# Patient Record
Sex: Female | Born: 1980 | Race: White | Hispanic: No | Marital: Single | Smoking: Current every day smoker
Health system: Southern US, Community
[De-identification: ages and names within clinical notes are randomized; demographics above are authoritative.]

## PROBLEM LIST (undated history)

## (undated) DIAGNOSIS — S069X9A Unspecified intracranial injury with loss of consciousness of unspecified duration, initial encounter: Secondary | ICD-10-CM

## (undated) DIAGNOSIS — J45909 Unspecified asthma, uncomplicated: Secondary | ICD-10-CM

## (undated) DIAGNOSIS — F0781 Postconcussional syndrome: Secondary | ICD-10-CM

## (undated) DIAGNOSIS — S069XAA Unspecified intracranial injury with loss of consciousness status unknown, initial encounter: Secondary | ICD-10-CM

---

## 2017-03-10 ENCOUNTER — Encounter (HOSPITAL_COMMUNITY): Payer: Self-pay

## 2017-03-10 ENCOUNTER — Emergency Department (HOSPITAL_COMMUNITY): Payer: Worker's Compensation

## 2017-03-10 ENCOUNTER — Emergency Department (HOSPITAL_COMMUNITY)
Admission: EM | Admit: 2017-03-10 | Discharge: 2017-03-10 | Disposition: A | Discharge status: Home / Self Care | Payer: Worker's Compensation | Attending: Emergency Medicine | Admitting: Emergency Medicine

## 2017-03-10 DIAGNOSIS — S199XXA Unspecified injury of neck, initial encounter: Secondary | ICD-10-CM | POA: Diagnosis present

## 2017-03-10 DIAGNOSIS — F17210 Nicotine dependence, cigarettes, uncomplicated: Secondary | ICD-10-CM | POA: Insufficient documentation

## 2017-03-10 DIAGNOSIS — Y999 Unspecified external cause status: Secondary | ICD-10-CM | POA: Diagnosis not present

## 2017-03-10 DIAGNOSIS — J45909 Unspecified asthma, uncomplicated: Secondary | ICD-10-CM | POA: Insufficient documentation

## 2017-03-10 DIAGNOSIS — Y92410 Unspecified street and highway as the place of occurrence of the external cause: Secondary | ICD-10-CM | POA: Insufficient documentation

## 2017-03-10 DIAGNOSIS — R51 Headache: Secondary | ICD-10-CM | POA: Diagnosis not present

## 2017-03-10 DIAGNOSIS — R519 Headache, unspecified: Secondary | ICD-10-CM

## 2017-03-10 DIAGNOSIS — Y939 Activity, unspecified: Secondary | ICD-10-CM | POA: Diagnosis not present

## 2017-03-10 DIAGNOSIS — M542 Cervicalgia: Secondary | ICD-10-CM

## 2017-03-10 HISTORY — DX: Unspecified asthma, uncomplicated: J45.909

## 2017-03-10 MED ORDER — METHOCARBAMOL 500 MG PO TABS
500.0000 mg | ORAL_TABLET | Freq: Once | ORAL | Status: AC
  Administered 2017-03-10: 500 mg via ORAL
  Filled 2017-03-10: qty 1

## 2017-03-10 MED ORDER — NAPROXEN 250 MG PO TABS
500.0000 mg | ORAL_TABLET | Freq: Once | ORAL | Status: AC
  Administered 2017-03-10: 500 mg via ORAL
  Filled 2017-03-10: qty 2

## 2017-03-10 MED ORDER — METHOCARBAMOL 500 MG PO TABS: 500.0000 mg | ORAL_TABLET | Freq: Two times a day (BID) | ORAL | 0 refills | Status: AC

## 2017-03-10 MED ORDER — NAPROXEN 500 MG PO TABS: 500.0000 mg | ORAL_TABLET | Freq: Two times a day (BID) | ORAL | 0 refills | Status: AC
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
## 2017-08-26 ENCOUNTER — Emergency Department (HOSPITAL_COMMUNITY): Payer: Self-pay

## 2017-08-26 ENCOUNTER — Emergency Department (HOSPITAL_COMMUNITY)
Admission: EM | Admit: 2017-08-26 | Discharge: 2017-08-27 | Disposition: A | Discharge status: Home / Self Care | Payer: Self-pay | Attending: Emergency Medicine | Admitting: Emergency Medicine

## 2017-08-26 ENCOUNTER — Encounter (HOSPITAL_COMMUNITY): Payer: Self-pay | Admitting: Emergency Medicine

## 2017-08-26 DIAGNOSIS — F17210 Nicotine dependence, cigarettes, uncomplicated: Secondary | ICD-10-CM | POA: Insufficient documentation

## 2017-08-26 DIAGNOSIS — N12 Tubulo-interstitial nephritis, not specified as acute or chronic: Secondary | ICD-10-CM | POA: Insufficient documentation

## 2017-08-26 DIAGNOSIS — Z79899 Other long term (current) drug therapy: Secondary | ICD-10-CM | POA: Insufficient documentation

## 2017-08-26 DIAGNOSIS — J45909 Unspecified asthma, uncomplicated: Secondary | ICD-10-CM | POA: Insufficient documentation

## 2017-08-26 HISTORY — DX: Postconcussional syndrome: F07.81

## 2017-08-26 HISTORY — DX: Unspecified intracranial injury with loss of consciousness of unspecified duration, initial encounter: S06.9X9A

## 2017-08-26 HISTORY — DX: Unspecified intracranial injury with loss of consciousness status unknown, initial encounter: S06.9XAA

## 2017-08-26 LAB — URINALYSIS, ROUTINE W REFLEX MICROSCOPIC
Bilirubin Urine: NEGATIVE
GLUCOSE, UA: NEGATIVE mg/dL
Ketones, ur: NEGATIVE mg/dL
NITRITE: NEGATIVE
PH: 7 (ref 5.0–8.0)
PROTEIN: 30 mg/dL — AB
Specific Gravity, Urine: 1.012 (ref 1.005–1.030)

## 2017-08-26 LAB — COMPREHENSIVE METABOLIC PANEL
ALK PHOS: 60 U/L (ref 38–126)
ALT: 6 U/L — ABNORMAL LOW (ref 14–54)
ANION GAP: 8 (ref 5–15)
AST: 17 U/L (ref 15–41)
Albumin: 4.1 g/dL (ref 3.5–5.0)
BILIRUBIN TOTAL: 0.6 mg/dL (ref 0.3–1.2)
BUN: 13 mg/dL (ref 6–20)
CALCIUM: 9.5 mg/dL (ref 8.9–10.3)
CO2: 24 mmol/L (ref 22–32)
Chloride: 105 mmol/L (ref 101–111)
Creatinine, Ser: 0.89 mg/dL (ref 0.44–1.00)
GFR calc non Af Amer: >60 mL/min (ref >60)
GLUCOSE: 94 mg/dL (ref 65–99)
POTASSIUM: 4.2 mmol/L (ref 3.5–5.1)
Sodium: 137 mmol/L (ref 135–145)
TOTAL PROTEIN: 7.2 g/dL (ref 6.5–8.1)

## 2017-08-26 LAB — CBC
HEMATOCRIT: 40.7 % (ref 36.0–46.0)
HEMOGLOBIN: 13.7 g/dL (ref 12.0–15.0)
MCH: 30.6 pg (ref 26.0–34.0)
MCHC: 33.7 g/dL (ref 30.0–36.0)
MCV: 90.8 fL (ref 78.0–100.0)
PLATELETS: 206 10*3/uL (ref 150–400)
RBC: 4.48 MIL/uL (ref 3.87–5.11)
RDW: 12.9 % (ref 11.5–15.5)
WBC: 9.6 10*3/uL (ref 4.0–10.5)

## 2017-08-26 LAB — I-STAT BETA HCG BLOOD, ED (MC, WL, AP ONLY): I-stat hCG, quantitative: <5 m[IU]/mL (ref <5)

## 2017-08-26 LAB — WET PREP, GENITAL
SPERM: NONE SEEN
TRICH WET PREP: NONE SEEN
YEAST WET PREP: NONE SEEN

## 2017-08-26 LAB — LIPASE, BLOOD: Lipase: 34 U/L (ref 11–51)

## 2017-08-26 MED ORDER — CEPHALEXIN 500 MG PO CAPS: 500.0000 mg | ORAL_CAPSULE | Freq: Four times a day (QID) | ORAL | 0 refills | Status: AC

## 2017-08-26 MED ORDER — KETOROLAC TROMETHAMINE 30 MG/ML IJ SOLN
15.0000 mg | Freq: Once | INTRAMUSCULAR | Status: AC
  Administered 2017-08-27: 15 mg via INTRAVENOUS
  Filled 2017-08-26: qty 1

## 2017-08-26 MED ORDER — ONDANSETRON HCL 4 MG/2ML IJ SOLN
4.0000 mg | Freq: Once | INTRAMUSCULAR | Status: AC
  Administered 2017-08-26: 4 mg via INTRAVENOUS
  Filled 2017-08-26: qty 2

## 2017-08-26 MED ORDER — ONDANSETRON 8 MG PO TBDP: 8.0000 mg | ORAL_TABLET | Freq: Three times a day (TID) | ORAL | 0 refills | Status: AC | PRN

## 2017-08-26 MED ORDER — METRONIDAZOLE 500 MG PO TABS: 500.0000 mg | ORAL_TABLET | Freq: Two times a day (BID) | ORAL | 0 refills | Status: AC

## 2017-08-26 MED ORDER — MORPHINE SULFATE (PF) 4 MG/ML IV SOLN
4.0000 mg | Freq: Once | INTRAVENOUS | Status: AC
  Administered 2017-08-26: 4 mg via INTRAVENOUS
  Filled 2017-08-26: qty 1

## 2017-08-26 MED ORDER — DEXTROSE 5 % IV SOLN
1.0000 g | INTRAVENOUS | Status: DC
  Administered 2017-08-26: 1 g via INTRAVENOUS
  Filled 2017-08-26: qty 10

## 2017-08-26 MED ORDER — SODIUM CHLORIDE 0.9 % IV SOLN: INTRAVENOUS | Status: DC

## 2017-08-26 MED ORDER — OXYCODONE-ACETAMINOPHEN 5-325 MG PO TABS: 2.0000 | ORAL_TABLET | ORAL | 0 refills | Status: AC | PRN

## 2017-08-26 MED ORDER — SODIUM CHLORIDE 0.9 % IV BOLUS (SEPSIS)
1000.0000 mL | Freq: Once | INTRAVENOUS | Status: AC
  Administered 2017-08-26: 1000 mL via INTRAVENOUS

## 2017-08-26 NOTE — ED Triage Notes (Addendum)
Patient c/o severe RLQ and right pelvic pain since this morning. Denies V/D. Denies vaginal discharge and bleeding.

## 2017-08-26 NOTE — ED Notes (Signed)
Once antibiotics are completed, Huntley Dec, RN will collect HIV antibody.

## 2017-08-26 NOTE — ED Notes (Signed)
Pelvic cart is waiting at bedside.

## 2017-08-26 NOTE — ED Provider Notes (Signed)
[facility]-EMERGENCY DEPT Provider Note   CSN: [id number] Arrival date & time: 08/26/17  1333     History   Chief Complaint Chief Complaint  Patient presents with  . Abdominal Pain  . Pelvic Pain    HPI Pam Best is a 36 y.o. female.  36 year old female presents with 24-hour history of right lower quadrant pain it radiates to her right flank. Denies any vaginal bleeding or discharge. Denies any dysuria or hematuria. No fever or chills. Pain is dull and persistent and is worse with movement. No prior history of same. Patient is sexually active with the same partner for several years. Denies any prior history of PID. Does have a history of C-section in the past. Denies any diarrhea or change in her bowel habits. No treatment use prior to arrival      Past Medical History:  Diagnosis Date  . Asthma   . Post concussion syndrome   . TBI (traumatic brain injury) (HCC)     There are no active problems to display for this patient.   Past Surgical History:  Procedure Laterality Date  . CESAREAN SECTION      OB History    No data available       Home Medications    Prior to Admission medications   Medication Sig Start Date End Date Taking? Authorizing Provider  methocarbamol (ROBAXIN) 500 MG tablet Take 1 tablet (500 mg total) by mouth 2 (two) times daily. 03/10/17   Garlon Hatchet, PA-C  naproxen (NAPROSYN) 500 MG tablet Take 1 tablet (500 mg total) by mouth 2 (two) times daily with a meal. 03/10/17   Garlon Hatchet, PA-C    Family History History reviewed. No pertinent family history.  Social History Social History  Substance Use Topics  . Smoking status: Current Every Day Smoker    Packs/day: 0.50    Types: Cigarettes  . Smokeless tobacco: Never Used  . Alcohol use Yes     Comment: occasional      Allergies   Nortriptyline   Review of Systems Review of Systems  All other systems reviewed and are negative.    Physical Exam Updated Vital Signs BP  132/87 (BP Location: Left Arm)   Pulse 93   Temp 98.6 F (37 C) (Oral)   Resp 18   Ht 1.6 m ( )   Wt 68.9 kg (152 lb)   LMP 06/22/2017   SpO2 100%   BMI 26.93 kg/m   Physical Exam  Constitutional: She is oriented to person, place, and time. She appears well-developed and well-nourished.  Non-toxic appearance. No distress.  HENT:  Head: Normocephalic and atraumatic.  Eyes: Pupils are equal, round, and reactive to light. Conjunctivae, EOM and lids are normal.  Neck: Normal range of motion. Neck supple. No tracheal deviation present. No thyroid mass present.  Cardiovascular: Normal rate, regular rhythm and normal heart sounds.  Exam reveals no gallop.   No murmur heard. Pulmonary/Chest: Effort normal and breath sounds normal. No stridor. No respiratory distress. She has no decreased breath sounds. She has no wheezes. She has no rhonchi. She has no rales.  Abdominal: Soft. Normal appearance and bowel sounds are normal. She exhibits no distension. There is no tenderness. There is no rebound and no CVA tenderness.    Genitourinary: Vagina normal. There is no rash on the right labia. There is no rash on the left labia. Right adnexum displays no mass. Left adnexum displays no mass.  Musculoskeletal: Normal range of  motion. She exhibits no edema or tenderness.  Neurological: She is alert and oriented to person, place, and time. She has normal strength. No cranial nerve deficit or sensory deficit. GCS eye subscore is 4. GCS verbal subscore is 5. GCS motor subscore is 6.  Skin: Skin is warm and dry. No abrasion and no rash noted.  Psychiatric: She has a normal mood and affect. Her speech is normal and behavior is normal.  Nursing note and vitals reviewed.    ED Treatments / Results  Labs (all labs ordered are listed, but only abnormal results are displayed) Labs Reviewed  COMPREHENSIVE METABOLIC PANEL - Abnormal; Notable for the following:       Result Value   ALT 6 (*)    All other  components within normal limits  URINALYSIS, ROUTINE W REFLEX MICROSCOPIC - Abnormal; Notable for the following:    APPearance HAZY (*)    Hgb urine dipstick MODERATE (*)    Protein, ur 30 (*)    Leukocytes, UA LARGE (*)    Bacteria, UA RARE (*)    Squamous Epithelial / LPF 0-5 (*)    All other components within normal limits  WET PREP, GENITAL  LIPASE, BLOOD  CBC  HIV ANTIBODY (ROUTINE TESTING)  I-STAT BETA HCG BLOOD, ED ([facility], [facility], [facility] ONLY)  GC/CHLAMYDIA PROBE AMP ([facility]) NOT AT [facility]    EKG  EKG Interpretation None       Radiology No results found.  Procedures Procedures (including critical care time)  Medications Ordered in ED Medications  sodium chloride 0.9 % bolus 1,000 mL (not administered)  0.9 %  sodium chloride infusion (not administered)  cefTRIAXone (ROCEPHIN) 1 g in dextrose 5 % 50 mL IVPB (not administered)  morphine 4 MG/ML injection 4 mg (not administered)  ondansetron (ZOFRAN) injection 4 mg (not administered)     Initial Impression / Assessment and Plan / ED Course  I have reviewed the triage vital signs and the nursing notes.  Pertinent labs & imaging results that were available during my care of the patient were reviewed by me and considered in my medical decision making (see chart for details).     Patient medicated for pain here and feels better. Has evidence of BV on her wet prep. 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 Pelvic ultrasound negative for acute pathology. CT also without acute findings. Patient given IV antibiotics here. Will place on Keflex, Flagyl, opiate medication  Final Clinical Impressions(s) / ED Diagnoses   Final diagnoses:  None    New Prescriptions New Prescriptions   No medications on file     Lorre Nick, MD 08/26/17 2304

## 2017-08-28 LAB — GC/CHLAMYDIA PROBE AMP (~~LOC~~) NOT AT ARMC
Chlamydia: NEGATIVE
NEISSERIA GONORRHEA: NEGATIVE

## 2018-05-20 ENCOUNTER — Emergency Department (HOSPITAL_COMMUNITY)
Admission: EM | Admit: 2018-05-20 | Discharge: 2018-05-20 | Disposition: A | Discharge status: Home / Self Care | Payer: Medicaid Other | Attending: Emergency Medicine | Admitting: Emergency Medicine

## 2018-05-20 ENCOUNTER — Emergency Department (HOSPITAL_COMMUNITY): Payer: Medicaid Other

## 2018-05-20 ENCOUNTER — Encounter (HOSPITAL_COMMUNITY): Payer: Self-pay | Admitting: Emergency Medicine

## 2018-05-20 DIAGNOSIS — J45909 Unspecified asthma, uncomplicated: Secondary | ICD-10-CM | POA: Diagnosis not present

## 2018-05-20 DIAGNOSIS — F17210 Nicotine dependence, cigarettes, uncomplicated: Secondary | ICD-10-CM | POA: Insufficient documentation

## 2018-05-20 DIAGNOSIS — R9431 Abnormal electrocardiogram [ECG] [EKG]: Secondary | ICD-10-CM | POA: Diagnosis not present

## 2018-05-20 DIAGNOSIS — R112 Nausea with vomiting, unspecified: Secondary | ICD-10-CM | POA: Diagnosis not present

## 2018-05-20 DIAGNOSIS — R200 Anesthesia of skin: Secondary | ICD-10-CM | POA: Diagnosis not present

## 2018-05-20 DIAGNOSIS — M542 Cervicalgia: Secondary | ICD-10-CM

## 2018-05-20 DIAGNOSIS — Z79899 Other long term (current) drug therapy: Secondary | ICD-10-CM | POA: Insufficient documentation

## 2018-05-20 LAB — CBC WITH DIFFERENTIAL/PLATELET
Abs Immature Granulocytes: 0 10*3/uL (ref 0.0–0.1)
BASOS PCT: 1 %
Basophils Absolute: 0 10*3/uL (ref 0.0–0.1)
EOS ABS: 0.1 10*3/uL (ref 0.0–0.7)
EOS PCT: 2 %
HCT: 37.1 % (ref 36.0–46.0)
Hemoglobin: 12.3 g/dL (ref 12.0–15.0)
Immature Granulocytes: 0 %
Lymphocytes Relative: 36 %
Lymphs Abs: 2.4 10*3/uL (ref 0.7–4.0)
MCH: 29.6 pg (ref 26.0–34.0)
MCHC: 33.2 g/dL (ref 30.0–36.0)
MCV: 89.4 fL (ref 78.0–100.0)
MONO ABS: 0.5 10*3/uL (ref 0.1–1.0)
Monocytes Relative: 8 %
Neutro Abs: 3.5 10*3/uL (ref 1.7–7.7)
Neutrophils Relative %: 53 %
Platelets: 167 10*3/uL (ref 150–400)
RBC: 4.15 MIL/uL (ref 3.87–5.11)
RDW: 13.2 % (ref 11.5–15.5)
WBC: 6.6 10*3/uL (ref 4.0–10.5)

## 2018-05-20 LAB — COMPREHENSIVE METABOLIC PANEL
ALBUMIN: 3.9 g/dL (ref 3.5–5.0)
ALT: 9 U/L — AB (ref 14–54)
AST: 23 U/L (ref 15–41)
Alkaline Phosphatase: 57 U/L (ref 38–126)
Anion gap: 9 (ref 5–15)
BILIRUBIN TOTAL: 1 mg/dL (ref 0.3–1.2)
BUN: 13 mg/dL (ref 6–20)
CHLORIDE: 105 mmol/L (ref 101–111)
CO2: 24 mmol/L (ref 22–32)
CREATININE: 1.07 mg/dL — AB (ref 0.44–1.00)
Calcium: 9 mg/dL (ref 8.9–10.3)
GFR calc Af Amer: >60 mL/min (ref >60)
GLUCOSE: 89 mg/dL (ref 65–99)
POTASSIUM: 3.5 mmol/L (ref 3.5–5.1)
Sodium: 138 mmol/L (ref 135–145)
Total Protein: 6.5 g/dL (ref 6.5–8.1)

## 2018-05-20 LAB — RAPID URINE DRUG SCREEN, HOSP PERFORMED
Amphetamines: NOT DETECTED
Benzodiazepines: POSITIVE — AB
Cocaine: NOT DETECTED
Opiates: NOT DETECTED
TETRAHYDROCANNABINOL: NOT DETECTED

## 2018-05-20 LAB — URINALYSIS, ROUTINE W REFLEX MICROSCOPIC
Bilirubin Urine: NEGATIVE
Glucose, UA: NEGATIVE mg/dL
Hgb urine dipstick: NEGATIVE
KETONES UR: 5 mg/dL — AB
Leukocytes, UA: NEGATIVE
Nitrite: NEGATIVE
PH: 7 (ref 5.0–8.0)
Protein, ur: NEGATIVE mg/dL
SPECIFIC GRAVITY, URINE: 1.019 (ref 1.005–1.030)

## 2018-05-20 LAB — RAPID HIV SCREEN (HIV 1/2 AB+AG)
HIV 1/2 ANTIBODIES: NONREACTIVE
HIV-1 P24 ANTIGEN - HIV24: NONREACTIVE

## 2018-05-20 LAB — POC URINE PREG, ED: Preg Test, Ur: NEGATIVE

## 2018-05-20 LAB — CK: CK TOTAL: 196 U/L (ref 38–234)

## 2018-05-20 LAB — ETHANOL

## 2018-05-20 MED ORDER — DIAZEPAM 5 MG PO TABS
5.0000 mg | ORAL_TABLET | Freq: Once | ORAL | Status: AC
  Administered 2018-05-20: 5 mg via ORAL
  Filled 2018-05-20: qty 1

## 2018-05-20 MED ORDER — ONDANSETRON 4 MG PO TBDP
4.0000 mg | ORAL_TABLET | Freq: Once | ORAL | Status: AC
  Administered 2018-05-20: 4 mg via ORAL
  Filled 2018-05-20: qty 1

## 2018-05-20 MED ORDER — SODIUM CHLORIDE 0.9 % IV BOLUS
1000.0000 mL | Freq: Once | INTRAVENOUS | Status: AC
  Administered 2018-05-20: 1000 mL via INTRAVENOUS

## 2018-05-20 MED ORDER — ONDANSETRON HCL 4 MG PO TABS
4.0000 mg | ORAL_TABLET | Freq: Two times a day (BID) | ORAL | 0 refills | Status: AC | PRN
Start: 2018-05-20 — End: ?

## 2018-05-20 MED ORDER — TETRACAINE HCL 0.5 % OP SOLN
1.0000 [drp] | Freq: Once | OPHTHALMIC | Status: AC
  Administered 2018-05-20: 1 [drp] via OPHTHALMIC
  Filled 2018-05-20: qty 4

## 2018-05-20 NOTE — ED Notes (Signed)
PA at bedside to check eye pressure

## 2018-05-20 NOTE — ED Notes (Signed)
Pt remains at MRI

## 2018-05-20 NOTE — ED Notes (Signed)
Pt escorted to the RR for urine sample.

## 2018-05-20 NOTE — ED Triage Notes (Signed)
Pt states she has had neck pain, stiff neck, blurred visions with spots to both eyes, nausea, diarrhea, pain to abdomen to RUQ, also has numbness to bilateral feet but this started this morning.

## 2018-05-20 NOTE — ED Notes (Signed)
Patient transported to MRI 

## 2018-05-20 NOTE — Discharge Instructions (Addendum)
Use Zofran as needed for nausea or vomiting. Use this only if vomiting is severe and causing dehydration, as it may cause heart problems that may lead to death.  Make sure you are staying well-hydrated with water. Follow-up with your neurologist on Wednesday at your scheduled appointment. Use Tylenol as needed for pain. Contact [facility] to establish primary care. Contact the to neurology groups to establish care in [place]. Return to the emergency room if you develop high fevers, blood in your stool, persistent vomiting despite medication, or any new or worsening symptoms.

## 2018-05-20 NOTE — ED Notes (Signed)
Pt alert and oriented, stable. Pt verbalized understanding of discharge instructions.

## 2018-05-20 NOTE — ED Provider Notes (Addendum)
[facility] EMERGENCY DEPARTMENT Provider Note   CSN: [id number] Arrival date & time: 05/20/18  1204     History   Chief Complaint Chief Complaint  Patient presents with  . Neck Pain  . Numbness    HPI Pam Best is a 37 y.o. female.  HPI  Patient is a 59 old female with a history of asthma, TBI, postconcussion syndrome (followed by concussion specialist) presenting for blurred vision, scotomas, numbness in feet and hands, facial numbness weakness, vomiting and diarrhea.  Patient reports that she began having left-sided neck pain  2 days ago, and now feels that her entire neck is sore particular chest pain the.  Patient is able to perform range of motion in all directions with her neck.  Patient reports that she is also had spontaneous vomiting over the past 2 days, that would wake her up from sleep.  Patient reports that she had epigastric to right upper quadrant pain that preceded this.  Patient reports that today she began experiencing bilateral blurred vision, left-sided scotomas, "numbness and tingling" at the bottom of her feet, decreased sensation in bilateral hands, feeling that her left arm is more weak, as well as numbness around her face.  Patient denies feeling any ascending weakness from the lower extremities to upper extremities.  Patient reports the symptoms began all at once.  Patient denies any rashes,  but does note that she had a longer and heavier menses than is typical for her, and has had more easy bruising and bleeding over the past 2 weeks.  Patient denies recorded fevers, but does report feeling chilled and having sweats yesterday.  Diarrhea began 2 weeks ago, and has been occurring daily.  Patient reports recent travel was to [place] in April 2019, and [place] a couple weeks ago where she was in wooded areas but denies any tick bites or rashes.  Patient denies any family history of autoimmune diseases, specifically no family history of  neurologic autoimmune diseases.  Past Medical History:  Diagnosis Date  . Asthma   . Post concussion syndrome   . TBI (traumatic brain injury) ([place])     There are no active problems to display for this patient.   Past Surgical History:  Procedure Laterality Date  . CESAREAN SECTION       OB History   None      Home Medications    Prior to Admission medications   Medication Sig Start Date End Date Taking? Authorizing Provider  aspirin-acetaminophen-caffeine (EXCEDRIN MIGRAINE) [phone] MG tablet Take 2 tablets by mouth every 6 (six) hours as needed for headache.   Yes [provider]  escitalopram (LEXAPRO) 20 MG tablet Take 30 mg by mouth at bedtime. 04/18/18  Yes [provider]  meclizine (ANTIVERT) 25 MG tablet Take 25 mg by mouth daily as needed for nausea/vomiting. 04/19/18  Yes [provider]  temazepam (RESTORIL) 30 MG capsule Take 30 mg by mouth daily as needed for sleep. 04/16/18  Yes [provider]  topiramate (TOPAMAX) 50 MG tablet Take 50 mg by mouth at bedtime. 04/25/18  Yes [provider]  traZODone (DESYREL) 50 MG tablet Take 100 mg by mouth at bedtime. 04/18/18  Yes [provider]  cephALEXin (KEFLEX) 500 MG capsule Take 1 capsule (500 mg total) by mouth 4 (four) times daily. Patient not taking: Reported on 05/20/2018 08/26/17   Lacretia Leigh, MD  methocarbamol (ROBAXIN) 500 MG tablet Take 1 tablet (500 mg total) by  mouth 2 (two) times daily. Patient not taking: Reported on 05/20/2018 03/10/17   Larene Pickett, PA-C  metroNIDAZOLE (FLAGYL) 500 MG tablet Take 1 tablet (500 mg total) by mouth 2 (two) times daily. Patient not taking: Reported on 05/20/2018 08/26/17   Lacretia Leigh, MD  naproxen (NAPROSYN) 500 MG tablet Take 1 tablet (500 mg total) by mouth 2 (two) times daily with a meal. Patient not taking: Reported on 05/20/2018 03/10/17   Larene Pickett, PA-C  ondansetron (ZOFRAN ODT) 8 MG disintegrating tablet  Take 1 tablet (8 mg total) by mouth every 8 (eight) hours as needed for nausea or vomiting. Patient not taking: Reported on 05/20/2018 08/26/17   Lacretia Leigh, MD  oxyCODONE-acetaminophen (PERCOCET/ROXICET) 5-325 MG tablet Take 2 tablets by mouth every 4 (four) hours as needed for severe pain. Patient not taking: Reported on 05/20/2018 08/26/17   Lacretia Leigh, MD    Family History No family history on file.  Social History Social History   Tobacco Use  . Smoking status: Current Every Day Smoker    Packs/day: 0.50    Types: Cigarettes  . Smokeless tobacco: Never Used  Substance Use Topics  . Alcohol use: Yes    Comment: occasional   . Drug use: Not on file     Allergies   Nortriptyline   Review of Systems Review of Systems  Constitutional: Negative for chills and fever.  HENT: Negative for congestion and rhinorrhea.   Eyes: Positive for visual disturbance. Negative for pain.  Respiratory: Negative for chest tightness and shortness of breath.   Cardiovascular: Negative for chest pain.  Gastrointestinal: Positive for abdominal pain, diarrhea, nausea and vomiting.  Genitourinary: Negative for dysuria and flank pain.  Musculoskeletal: Positive for neck pain and neck stiffness. Negative for arthralgias and myalgias.  Skin: Negative for rash.  Neurological: Positive for weakness and numbness. Negative for seizures, syncope and speech difficulty.  Psychiatric/Behavioral: Positive for sleep disturbance. Negative for confusion.     Physical Exam Updated Vital Signs BP 102/78   Pulse 72   Temp 98.4 F (36.9 C) (Oral)   Resp 17   Ht '5\' 3"'  (1.6 m)   Wt 73.9 kg (163 lb)   LMP 05/17/2018   SpO2 97%   BMI 28.87 kg/m   Physical Exam  Constitutional: She appears well-developed and well-nourished. No distress.  HENT:  Head: Normocephalic and atraumatic.  Mouth/Throat: Oropharynx is clear and moist.  Eyes: Pupils are equal, round, and reactive to light. Conjunctivae and EOM  are normal.  Tono-Pen pressure right eye 19 and left eye 16. Attempted to visualize fundi, however the process made patient nauseous.  No obvious hemorrhage, exudate, or papilledema on exam.  Neck: Normal range of motion. Neck supple.  No nuchal rigidity.  No meningismus.  Patient is able to range her neck in all directions.  Cardiovascular: Normal rate, regular rhythm, S1 normal and S2 normal.  No murmur heard. Pulmonary/Chest: Effort normal and breath sounds normal. She has no wheezes. She has no rales.  Abdominal: Soft. She exhibits no distension. There is tenderness. There is no guarding.  Mild tenderness to palpation of the right upper quadrant.  Liver nonpalpable.  Abdomen benign and nonsurgical.  Musculoskeletal: Normal range of motion. She exhibits no edema or deformity.  Neurological: She is alert.  Mental Status:  Alert, oriented, thought content appropriate, able to give a coherent history. Speech fluent without evidence of aphasia. Able to follow 2 step commands without difficulty.  Cranial Nerves:  II:  Peripheral visual fields grossly normal, pupils equal, round, reactive to light III,IV, VI: ptosis not present, extra-ocular motions intact bilaterally  V,VII: Slight drooping of right lip. Facial light touch sensation equal VIII: hearing grossly normal to voice  X: uvula elevates symmetrically  XI: bilateral shoulder shrug asymmetric with slight left sided weakness.  XII: midline tongue extension without fassiculations Motor:  Normal tone.  Left upper extremity 4+ out of 5 with grip strength and 5 out of 5 in right upper extremity.  Left lower extremity 4+ out of 5 with hip flexion, knee flexion, plantar and dorsiflexion, and right leg 5 out of 5 throughout.  Sensory: Patient is unable to distinguish sharp from dull touch in distal bilateral upper and lower extremity's. Deep Tendon Reflexes: 2+ and equal upper extremities. 3+ right patellar and right plantar. 2+ left patellar  and left plantar. No clonus. Cerebellar: normal finger-to-nose with bilateral upper extremities Gait: normal gait and balance Stance: Slight downward drifting of left arm with pronator drift but good coordination, strength, and position sense with tapping of bilateral arms (performed in sitting position). CV: distal pulses palpable throughout   Skin: Skin is warm and dry. No rash noted. No erythema.  Psychiatric: She has a normal mood and affect. Her behavior is normal. Judgment and thought content normal.  Nursing note and vitals reviewed.    ED Treatments / Results  Labs (all labs ordered are listed, but only abnormal results are displayed) Labs Reviewed  URINALYSIS, ROUTINE W REFLEX MICROSCOPIC - Abnormal; Notable for the following components:      Result Value   APPearance HAZY (*)    Ketones, ur 5 (*)    All other components within normal limits  COMPREHENSIVE METABOLIC PANEL - Abnormal; Notable for the following components:   Creatinine, Ser 1.07 (*)    ALT 9 (*)    All other components within normal limits  RAPID HIV SCREEN (HIV 1/2 AB+AG)  CK  CBC WITH DIFFERENTIAL/PLATELET  ETHANOL  RPR  RAPID URINE DRUG SCREEN, HOSP PERFORMED  POC URINE PREG, ED    EKG EKG Interpretation  Date/Time:  Sunday May 20 2018 14:42:55 EDT Ventricular Rate:  71 PR Interval:    QRS Duration: 93 QT Interval:  457 QTC Calculation: 497 R Axis:   -16 Text Interpretation:  Sinus rhythm Borderline left axis deviation Prolonged QT interval No previous tracing Confirmed by Blanchie Dessert [phone]) on 05/20/2018 3:29:21 PM   Radiology No results found.  Procedures Procedures (including critical care time)  Medications Ordered in ED Medications  sodium chloride 0.9 % bolus 1,000 mL (1,000 mLs Intravenous New Bag/Given 05/20/18 1427)  ondansetron (ZOFRAN-ODT) disintegrating tablet 4 mg (4 mg Oral Given 05/20/18 1447)     Initial Impression / Assessment and Plan / ED Course  I have  reviewed the triage vital signs and the nursing notes.  Pertinent labs & imaging results that were available during my care of the patient were reviewed by me and considered in my medical decision making (see chart for details).     Patient is nontoxic-appearing, afebrile, and in no acute distress.  Differential diagnosis includes demyelinating lesions, AIDP, brainstem CVA, TTP, neurosyphilis, HIV, neuromuscular disorder such as myasthenia gravis.  Doubt meningitis, as patient is demonstrating no meningeal signs, is afebrile, has no risk factors for viral meningitis, does not appear to be systemically ill.  Additionally, neck pain started as unilateral, and proceeded neurologic symptoms by 48 hours.  Patient symptoms are highly atypical for a IDP,  and she has no evidence of pulmonary involvement.  CBC is without any abnormality.  CMP without acute abnormality.  Rapid HIV is negative. CK is normal.  EKG demonstrates prolonged QT, possibly related to Lexapro use.  Per consultation with Dr. Rory Percy of neurology, plan to proceed with MRI of brain and cervical spine specifically looking for demyelinating lesions or any other signs of acute CVA particularly of the brainstem.  If abnormal, anticipate reconsultation of neurology. If completely normal, patient can have outpatient neurologic follow-up for other neurologic conditions.  Appreciate neurology involvement in the care of this patient.  Care signed out to Sophia Cavaccale at 4:30 PM.   Final Clinical Impressions(s) / ED Diagnoses   Final diagnoses:  Non-intractable vomiting with nausea, unspecified vomiting type  Neck pain  Numbness  Prolonged Q-T interval on ECG    ED Discharge Orders    None           Tamala Julian 05/20/18 1640    Blanchie Dessert, MD 05/21/18 2251

## 2018-05-21 LAB — RPR: RPR Ser Ql: NONREACTIVE

## 2018-05-21 NOTE — ED Provider Notes (Signed)
Pt signed out to me by A Dayton ScrapeMurray, PA-C.  Please see previous notes for further evaluation.   In brief, patient presenting for evaluation of neck pain, vomiting, blurred vision, numbness and tingling, and left arm weakness.  Additionally, reporting 2 days of diarrhea.  Initial work-up reassuring, no leukocytosis.  Creatinine stable.  Electrolytes stable.  Rapid HIV negative.  CK negative.  UA without infection.  Nausea improved with Zofran and fluids.  Plan for MRI brain and neck for further evaluation of symptoms.  MRI negative for acute findings.  Discussed with Dr. Christell ConstantMoore from neurology, who recommends outpatient follow-up for neurologic symptoms.  Discussed with patient.  Patient has appointment with neurologist on Wednesday, but would like information for neurology in [place], as her doctors in [place].  Patient requesting symptomatic treatment for vomiting and diarrhea.  Discussed with patient that antiemetics can cause widened QT which can be life threatening. Pt states she understands, would like zofran as needed. Discussed extensively risks of zofran including death. Discussed strict return precautions including CP, SOB, heart racing, SOB, or weakness. Pt to f/u with neurology and PCP. At this time, pt appears safe for d/c. Return precautions given. Pt states she understands and agrees to plan.    Alveria ApleyCaccavale, Casin Federici, PA-C 05/21/18 0129    Terrilee FilesButler, Michael C, MD 05/21/18 1144

## 2018-12-31 IMAGING — MR MR CERVICAL SPINE W/O CM
5 series · 37 of 48 positions shown · non-contrast
Comparison: 03/10/2017 CT head and cervical spine.

CLINICAL DATA: 36 y/o F; patient presenting with blurred vision,
scotomas, numbness in feet, numbness in hands, facial numbness with
weakness, vomiting, and diarrhea. History of traumatic brain injury
with postconcussion syndrome. Patient also reports left-sided neck
pain beginning 2 days ago.

EXAM:
MRI HEAD WITHOUT CONTRAST
MRI CERVICAL SPINE WITHOUT CONTRAST
TECHNIQUE: Multiplanar, multiecho pulse sequences of the brain and surrounding
structures, and cervical spine, to include the craniocervical
junction and cervicothoracic junction, were obtained without
intravenous contrast.

[Series 9: T2 · sagittal · 3.0mm · 0.69mm/px · 6 of 15 slices shown (1 of 2)]
[im 1/15]
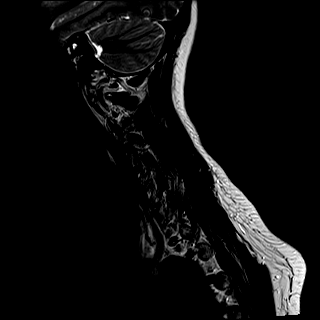
[im 3/15]
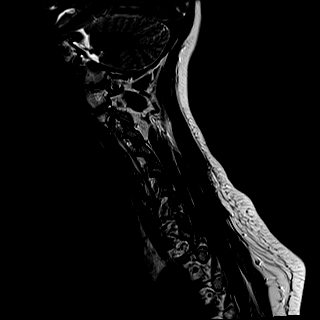
[im 6/15]
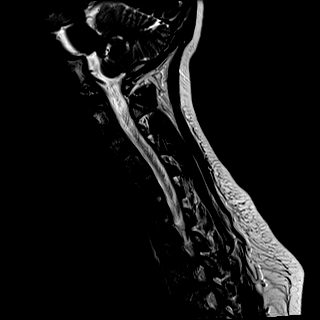
[im 9/15]
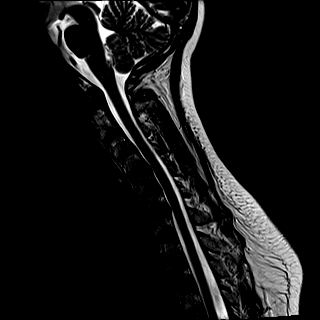
[im 12/15]
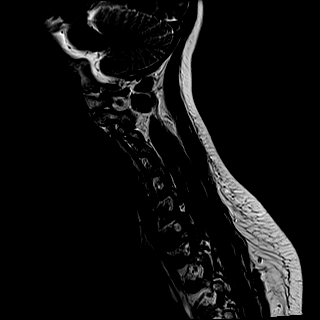
[im 15/15]
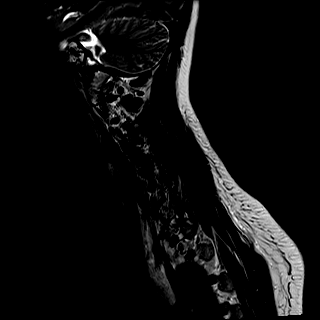

[Series 10: T1 · sagittal · 3.0mm · 0.69mm/px · 6 of 15 slices shown]
[im 1/15]
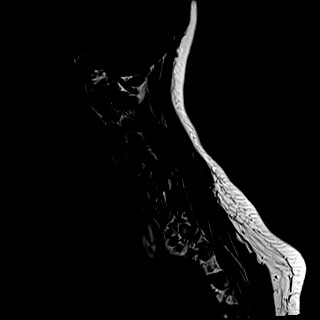
[im 3/15]
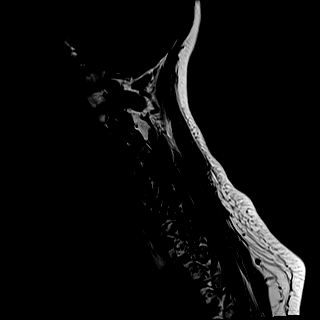
[im 6/15]
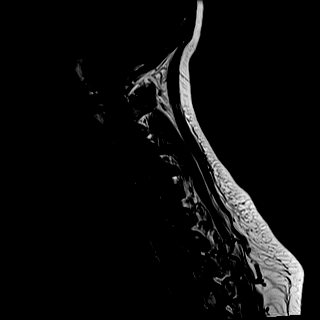
[im 9/15]
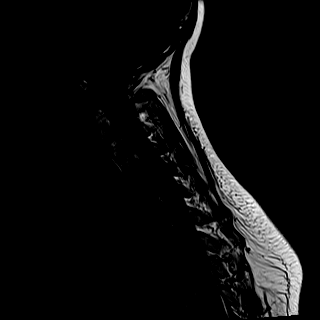
[im 12/15]
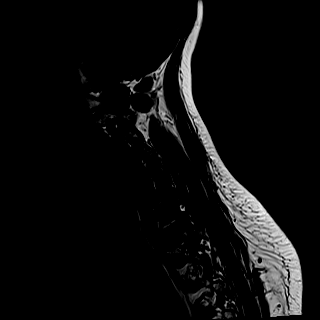
[im 15/15]
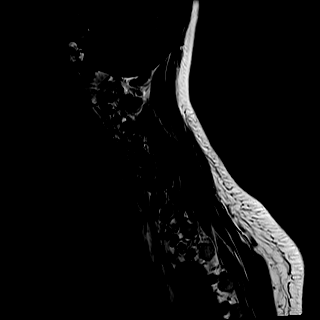

[Series 11: STIR · sagittal · 3.0mm · 0.86mm/px · 6 of 15 slices shown]
[im 1/15]
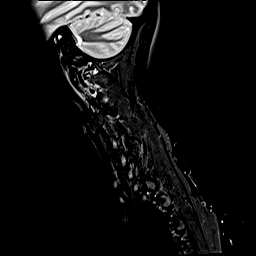
[im 3/15]
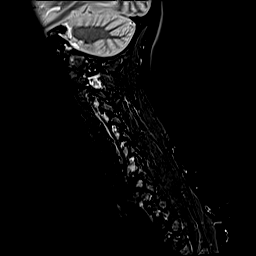
[im 6/15]
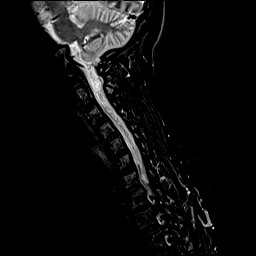
[im 9/15]
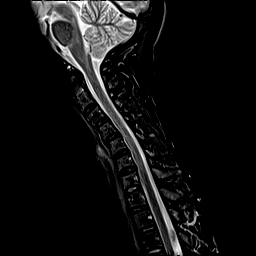
[im 12/15]
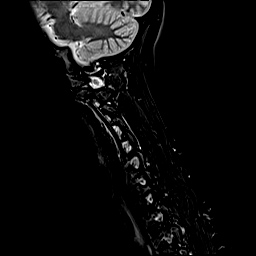
[im 15/15]
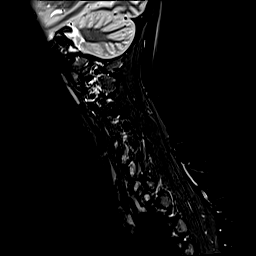

[Series 12: T2 · axial · 3.0mm · 0.66mm/px · z∈[-76,+45]mm · 11 of 40 slices shown (2 of 2)]
[im 1/40]
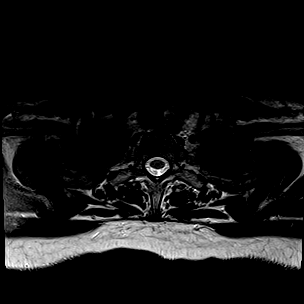
[im 3/40]
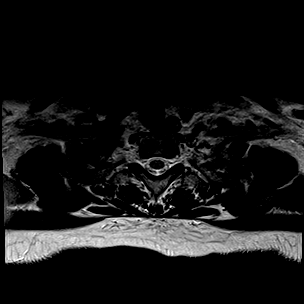
[im 6/40]
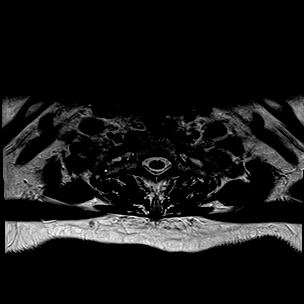
[im 9/40]
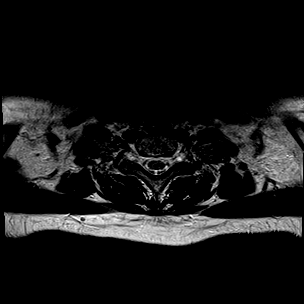
[im 12/40]
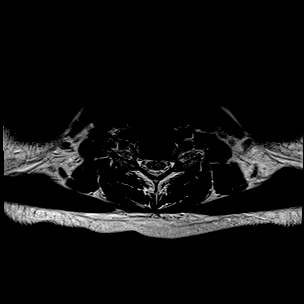
[im 17/40]
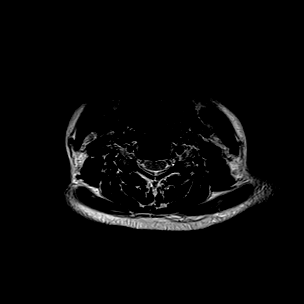
[im 20/40]
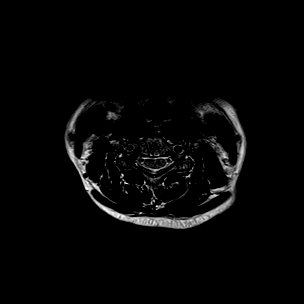
[im 23/40]
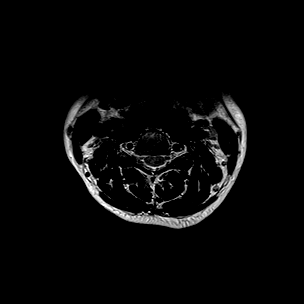
[im 28/40]
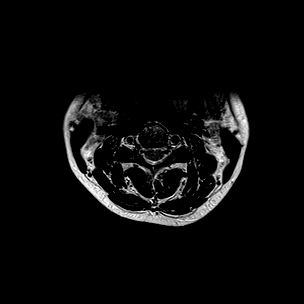
[im 34/40]
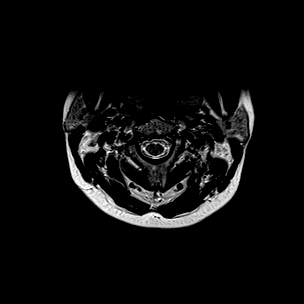
[im 40/40]
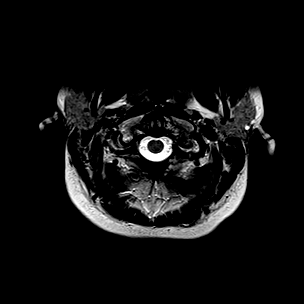

[Series 14: GRE · axial · 3.0mm · 0.39mm/px · z∈[-76,+45]mm · 8 of 40 slices shown]
[im 1/40]
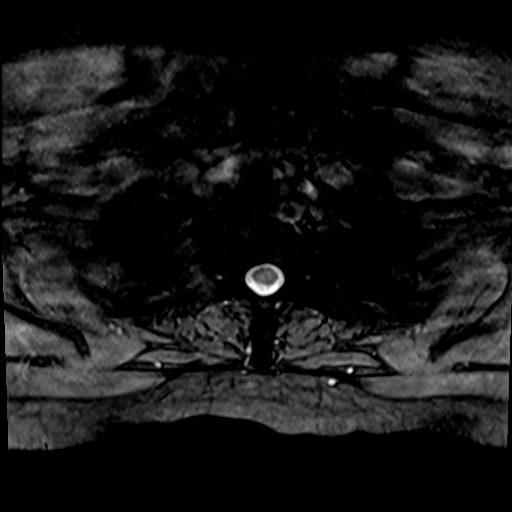
[im 6/40]
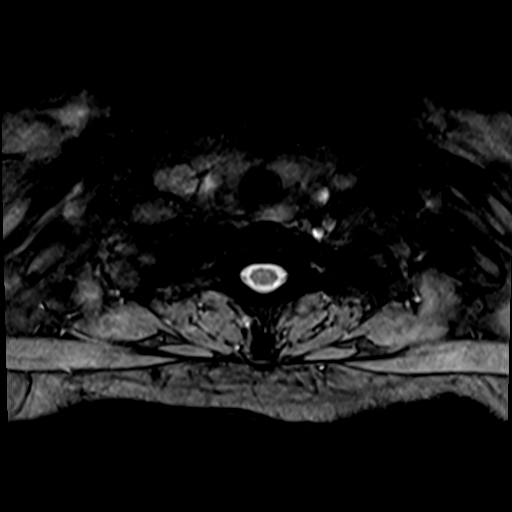
[im 12/40]
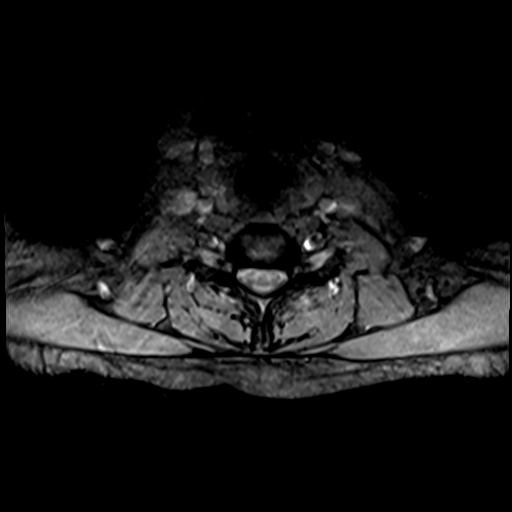
[im 17/40]
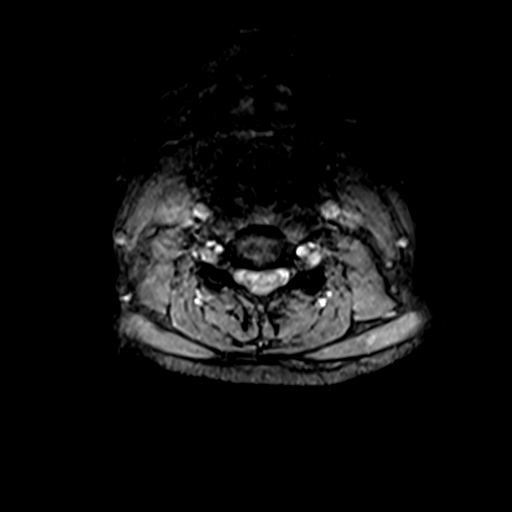
[im 23/40]
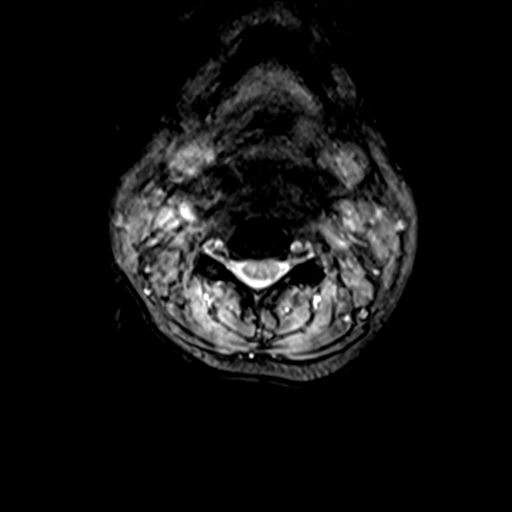
[im 28/40]
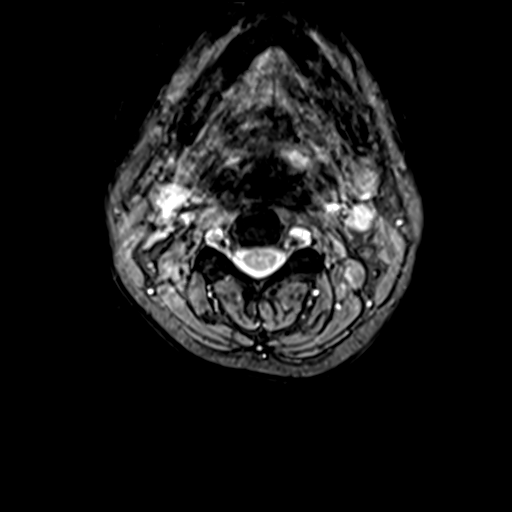
[im 34/40]
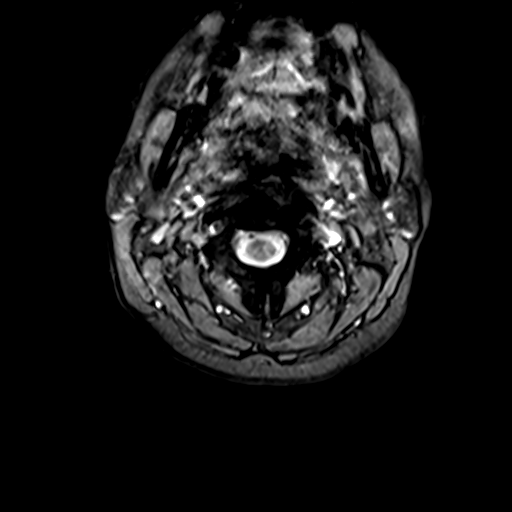
[im 40/40]
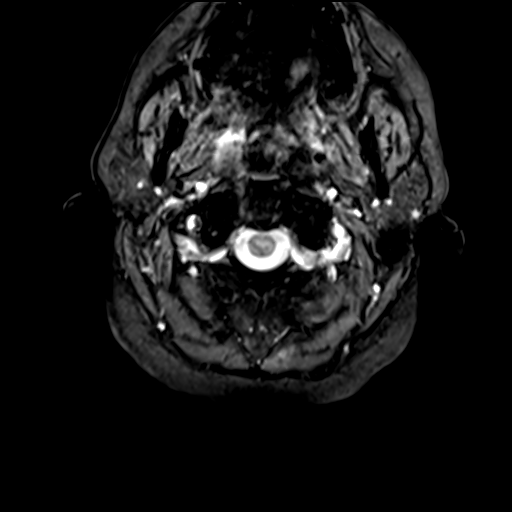

[37 of 48 positions shown; findings below may reference images not displayed]

FINDINGS: MRI HEAD FINDINGS

Brain: No acute infarction, hemorrhage, hydrocephalus, extra-axial
collection or mass lesion. No structural or signal abnormality of
the brain identified.

Vascular: Normal flow voids.

Skull and upper cervical spine: Normal marrow signal.

Sinuses/Orbits: Negative.

Other: None.

MRI CERVICAL SPINE FINDINGS

Alignment: Mild reversal of cervical curvature, likely positional.
No listhesis.

Vertebrae: No fracture, evidence of discitis, or bone lesion.

Cord: Normal signal and morphology.

Posterior Fossa, vertebral arteries, paraspinal tissues: Negative.

Disc levels: No significant foraminal stenosis, canal stenosis, or
disc displacement.
IMPRESSION: 1. No acute intracranial abnormality. Unremarkable MRI of the brain.
2. No findings of diffuse axonal injury or cortical contusion.
3. Normal MRI of the cervical spine.

By: Maria Felix Andrian M.D.
